# Patient Record
Sex: Female | Born: 1980 | Race: Asian | Hispanic: No | Marital: Married | Smoking: Never smoker
Health system: Southern US, Community
[De-identification: ages and names within clinical notes are randomized; demographics above are authoritative.]

## PROBLEM LIST (undated history)

## (undated) DIAGNOSIS — O009 Unspecified ectopic pregnancy without intrauterine pregnancy: Secondary | ICD-10-CM

## (undated) DIAGNOSIS — Z862 Personal history of diseases of the blood and blood-forming organs and certain disorders involving the immune mechanism: Secondary | ICD-10-CM

## (undated) DIAGNOSIS — A159 Respiratory tuberculosis unspecified: Secondary | ICD-10-CM

## (undated) HISTORY — DX: Personal history of diseases of the blood and blood-forming organs and certain disorders involving the immune mechanism: Z86.2

## (undated) HISTORY — DX: Respiratory tuberculosis unspecified: A15.9

## (undated) HISTORY — DX: Unspecified ectopic pregnancy without intrauterine pregnancy: O00.90

## (undated) HISTORY — PX: INDUCED ABORTION: SHX677

---

## 2011-04-04 DIAGNOSIS — O009 Unspecified ectopic pregnancy without intrauterine pregnancy: Secondary | ICD-10-CM

## 2011-04-04 HISTORY — DX: Unspecified ectopic pregnancy without intrauterine pregnancy: O00.90

## 2016-10-06 ENCOUNTER — Encounter: Payer: Self-pay | Admitting: Family Medicine

## 2016-10-06 ENCOUNTER — Other Ambulatory Visit (HOSPITAL_COMMUNITY)
Admission: RE | Admit: 2016-10-06 | Discharge: 2016-10-06 | Disposition: A | Discharge status: Home / Self Care | Payer: No Typology Code available for payment source | Source: Ambulatory Visit | Attending: Family Medicine | Admitting: Family Medicine

## 2016-10-06 ENCOUNTER — Ambulatory Visit (INDEPENDENT_AMBULATORY_CARE_PROVIDER_SITE_OTHER): Payer: No Typology Code available for payment source | Admitting: Family Medicine

## 2016-10-06 VITALS — BP 110/70 | HR 63 | Ht 62.0 in | Wt 106.2 lb

## 2016-10-06 DIAGNOSIS — Z124 Encounter for screening for malignant neoplasm of cervix: Secondary | ICD-10-CM | POA: Insufficient documentation

## 2016-10-06 DIAGNOSIS — Z862 Personal history of diseases of the blood and blood-forming organs and certain disorders involving the immune mechanism: Secondary | ICD-10-CM

## 2016-10-06 DIAGNOSIS — A159 Respiratory tuberculosis unspecified: Secondary | ICD-10-CM | POA: Diagnosis not present

## 2016-10-06 DIAGNOSIS — O009 Unspecified ectopic pregnancy without intrauterine pregnancy: Secondary | ICD-10-CM | POA: Diagnosis not present

## 2016-10-06 DIAGNOSIS — Z9229 Personal history of other drug therapy: Secondary | ICD-10-CM | POA: Insufficient documentation

## 2016-10-06 DIAGNOSIS — Z0000 Encounter for general adult medical examination without abnormal findings: Secondary | ICD-10-CM | POA: Insufficient documentation

## 2016-10-06 DIAGNOSIS — Z113 Encounter for screening for infections with a predominantly sexual mode of transmission: Secondary | ICD-10-CM | POA: Diagnosis not present

## 2016-10-06 LAB — COMPREHENSIVE METABOLIC PANEL
ALBUMIN: 4.6 g/dL (ref 3.6–5.1)
ALK PHOS: 33 U/L (ref 33–115)
ALT: 9 U/L (ref 6–29)
AST: 16 U/L (ref 10–30)
BUN: 12 mg/dL (ref 7–25)
CO2: 26 mmol/L (ref 20–31)
Calcium: 9.2 mg/dL (ref 8.6–10.2)
Chloride: 102 mmol/L (ref 98–110)
Creat: 0.8 mg/dL (ref 0.50–1.10)
Glucose, Bld: 93 mg/dL (ref 65–99)
POTASSIUM: 3.9 mmol/L (ref 3.5–5.3)
Sodium: 139 mmol/L (ref 135–146)
TOTAL PROTEIN: 7.1 g/dL (ref 6.1–8.1)
Total Bilirubin: 0.6 mg/dL (ref 0.2–1.2)

## 2016-10-06 LAB — CBC WITH DIFFERENTIAL/PLATELET
BASOS ABS: 0 {cells}/uL (ref 0–200)
BASOS PCT: 0 %
EOS ABS: 51 {cells}/uL (ref 15–500)
Eosinophils Relative: 1 %
HEMATOCRIT: 43.3 % (ref 35.0–45.0)
Hemoglobin: 14.2 g/dL (ref 11.7–15.5)
Lymphocytes Relative: 26 %
Lymphs Abs: 1326 cells/uL (ref 850–3900)
MCH: 32.2 pg (ref 27.0–33.0)
MCHC: 32.8 g/dL (ref 32.0–36.0)
MCV: 98.2 fL (ref 80.0–100.0)
MONO ABS: 204 {cells}/uL (ref 200–950)
MPV: 10.3 fL (ref 7.5–12.5)
Monocytes Relative: 4 %
NEUTROS ABS: 3519 {cells}/uL (ref 1500–7800)
Neutrophils Relative %: 69 %
Platelets: 151 10*3/uL (ref 140–400)
RBC: 4.41 MIL/uL (ref 3.80–5.10)
RDW: 13.1 % (ref 11.0–15.0)
WBC: 5.1 10*3/uL (ref 4.0–10.5)

## 2016-10-06 LAB — LIPID PANEL
CHOLESTEROL: 193 mg/dL (ref <200)
HDL: 67 mg/dL (ref >50)
LDL Cholesterol: 108 mg/dL — ABNORMAL HIGH (ref <100)
TRIGLYCERIDES: 90 mg/dL (ref <150)
Total CHOL/HDL Ratio: 2.9 Ratio (ref <5.0)
VLDL: 18 mg/dL (ref <30)

## 2016-10-06 LAB — POCT URINALYSIS DIP (PROADVANTAGE DEVICE)
BILIRUBIN UA: NEGATIVE
BILIRUBIN UA: NEGATIVE mg/dL
Glucose, UA: NEGATIVE mg/dL
LEUKOCYTES UA: NEGATIVE
NITRITE UA: NEGATIVE
Protein Ur, POC: NEGATIVE mg/dL
RBC UA: NEGATIVE
Specific Gravity, Urine: 1.025
Urobilinogen, Ur: NEGATIVE
pH, UA: 6 (ref 5.0–8.0)

## 2016-10-06 LAB — TSH: TSH: 2.94 mIU/L

## 2016-10-06 NOTE — Progress Notes (Signed)
Subjective:    Patient ID: Holly Dunlap, female    DOB: 1980-05-07, 36 y.o.   MRN: [id number]  HPI Chief Complaint  Patient presents with  . new pt    new pt cpe. wants to be checked for platelets cause of brusies   She is new to the practice and here for a complete physical exam. Moved here April 2017 from [place]. She lived in [place] before.   Previous medical care: in [place] and [place].    Other providers: none   Past medical history: TB treated- diagnosed in 2009. States she has an XR.   Social history: Lives with husband and 60 year old, is not working. Just started driving. Hopes to go to school for nursing, LPN.  Denies smoking, drinking alcohol, drug use  Diet: healthy  Excerise: nothing in several weeks   Immunizations: up to date.   Health maintenance:  Mammogram: N/A Colonoscopy: N/A Last Gynecological Exam: 3 years ago Last Menstrual cycle: 09/22/2016 Pregnancies: 4, 1 living Last Dental Exam: 2 years ago.  Last Eye Exam: prescription glasses.   Wears seatbelt always, uses sunscreen, smoke detectors in home and functioning, does not text while driving and feels safe in home environment.   Reviewed allergies, medications, past medical, surgical, family, and social history.    Review of Systems Review of Systems Constitutional: -fever, -chills, -sweats, -unexpected weight change,-fatigue ENT: -runny nose, -ear pain, -sore throat Cardiology:  -chest pain, -palpitations, -edema Respiratory: -cough, -shortness of breath, -wheezing Gastroenterology: -abdominal pain, -nausea, -vomiting, -diarrhea, -constipation  Hematology: -bleeding or bruising problems Musculoskeletal: -arthralgias, -myalgias, -joint swelling, -back pain Ophthalmology: -vision changes Urology: -dysuria, -difficulty urinating, -hematuria, -urinary frequency, -urgency Neurology: -headache, -weakness, -tingling, -numbness       Objective:   Physical Exam BP 110/70   Pulse 63   Ht 5'  2" (1.575 m)   Wt 106 lb 3.2 oz (48.2 kg)   LMP 09/22/2016   BMI 19.42 kg/m   General Appearance:    Alert, cooperative, no distress, appears stated age  Head:    Normocephalic, without obvious abnormality, atraumatic  Eyes:    PERRL, conjunctiva/corneas clear, EOM's intact, fundi    benign  Ears:    Normal TM's and external ear canals  Nose:   Nares normal, mucosa normal, no drainage or sinus   tenderness  Throat:   Lips, mucosa, and tongue normal; teeth and gums normal  Neck:   Supple, no lymphadenopathy;  thyroid:  no   enlargement/tenderness/nodules; no carotid   bruit or JVD  Back:    Spine nontender, no curvature, ROM normal, no CVA     tenderness  Lungs:     Clear to auscultation bilaterally without wheezes, rales or     ronchi; respirations unlabored  Chest Wall:    No tenderness or deformity   Heart:    Regular rate and rhythm, S1 and S2 normal, no murmur, rub   or gallop  Breast Exam:    No tenderness, masses, or nipple discharge or inversion.      No axillary lymphadenopathy  Abdomen:     Soft, non-tender, nondistended, normoactive bowel sounds,    no masses, no hepatosplenomegaly  Genitalia:    Normal external genitalia without lesions.  BUS and vagina normal; cervix without lesions, or cervical motion tenderness. No abnormal vaginal discharge.  Uterus and adnexa not enlarged, nontender, no masses.  Pap performed. Chaperone present.   Rectal:    Not performed due to age<40 and  no related complaints  Extremities:   No clubbing, cyanosis or edema  Pulses:   2+ and symmetric all extremities  Skin:   Skin color, texture, turgor normal, no rashes or lesions  Lymph nodes:   Cervical, supraclavicular, and axillary nodes normal  Neurologic:   CNII-XII intact, normal strength, sensation and gait; reflexes 2+ and symmetric throughout          Psych:   Normal mood, affect, hygiene and grooming.     Urinalysis dipstick: neg      Assessment & Plan:  Routine general medical  examination at a health care facility - Plan: POCT Urinalysis DIP (Proadvantage Device), CBC with Differential/Platelet, Comprehensive metabolic panel, Lipid panel, Cytology - PAP, TSH  History of thrombocytopenia - Plan: CBC with Differential/Platelet  TB (tuberculosis), treated  Ectopic pregnancy, unspecified location, unspecified whether intrauterine pregnancy present  Screening for cervical cancer - Plan: Cytology - PAP  Screen for STD (sexually transmitted disease) - Plan: RPR, HIV antibody  She appears to be doing well.  Discussed that she should tell anyone who checks her for a TB skin test of her history of being treated in 2009. She had appropriate follow up.  STD testing done.  Pap smear done.  Discussed safety and health promotion.  Follow up pending results.

## 2016-10-06 NOTE — Patient Instructions (Addendum)
Make sure you are getting at least 150 minutes of physical activity per week.  Call and schedule a dental exam for cleaning and preventive care.   we will call you with your lab results.    Preventative Care for Adults - Female      MAINTAIN REGULAR HEALTH EXAMS:  A routine yearly physical is a good way to check in with your primary care provider about your health and preventive screening. It is also an opportunity to share updates about your health and any concerns you have, and receive a thorough all-over exam.   Most health insurance companies pay for at least some preventative services.  Check with your health plan for specific coverages.  WHAT PREVENTATIVE SERVICES DO WOMEN NEED?  Adult women should have their weight and blood pressure checked regularly.   Women age 635 and older should have their cholesterol levels checked regularly.  Women should be screened for cervical cancer with a Pap smear and pelvic exam beginning at either age 35, or 3 years after they become sexually activity.    Breast cancer screening generally begins at age 35 with a mammogram and breast exam by your primary care provider.    Beginning at age 35 and continuing to age 35, women should be screened for colorectal cancer.  Certain people may need continued testing until age 35.  Updating vaccinations is part of preventative care.  Vaccinations help protect against diseases such as the flu.  Osteoporosis is a disease in which the bones lose minerals and strength as we age. Women ages 7665 and over should discuss this with their caregivers, as should women after menopause who have other risk factors.  Lab tests are generally done as part of preventative care to screen for anemia and blood disorders, to screen for problems with the kidneys and liver, to screen for bladder problems, to check blood sugar, and to check your cholesterol level.  Preventative services generally include counseling about diet,  exercise, avoiding tobacco, drugs, excessive alcohol consumption, and sexually transmitted infections.    GENERAL RECOMMENDATIONS FOR GOOD HEALTH:  Healthy diet:  Eat a variety of foods, including fruit, vegetables, animal or vegetable protein, such as meat, fish, chicken, and eggs, or beans, lentils, tofu, and grains, such as rice.  Drink plenty of water daily.  Decrease saturated fat in the diet, avoid lots of red meat, processed foods, sweets, fast foods, and fried foods.  Exercise:  Aerobic exercise helps maintain good heart health. At least 30-40 minutes of moderate-intensity exercise is recommended. For example, a brisk walk that increases your heart rate and breathing. This should be done on most days of the week.   Find a type of exercise or a variety of exercises that you enjoy so that it becomes a part of your daily life.  Examples are running, walking, swimming, water aerobics, and biking.  For motivation and support, explore group exercise such as aerobic class, spin class, Zumba, Yoga,or  martial arts, etc.    Set exercise goals for yourself, such as a certain weight goal, walk or run in a race such as a 5k walk/run.  Speak to your primary care provider about exercise goals.  Disease prevention:  If you smoke or chew tobacco, find out from your caregiver how to quit. It can literally save your life, no matter how long you have been a tobacco user. If you do not use tobacco, never begin.   Maintain a healthy diet and normal weight. Increased weight  leads to problems with blood pressure and diabetes.   The Body Mass Index or BMI is a way of measuring how much of your body is fat. Having a BMI above 27 increases the risk of heart disease, diabetes, hypertension, stroke and other problems related to obesity. Your caregiver can help determine your BMI and based on it develop an exercise and dietary program to help you achieve or maintain this important measurement at a healthful  level.  High blood pressure causes heart and blood vessel problems.  Persistent high blood pressure should be treated with medicine if weight loss and exercise do not work.   Fat and cholesterol leaves deposits in your arteries that can block them. This causes heart disease and vessel disease elsewhere in your body.  If your cholesterol is found to be high, or if you have heart disease or certain other medical conditions, then you may need to have your cholesterol monitored frequently and be treated with medication.   Ask if you should have a cardiac stress test if your history suggests this. A stress test is a test done on a treadmill that looks for heart disease. This test can find disease prior to there being a problem.  Menopause can be associated with physical symptoms and risks. Hormone replacement therapy is available to decrease these. You should talk to your caregiver about whether starting or continuing to take hormones is right for you.   Osteoporosis is a disease in which the bones lose minerals and strength as we age. This can result in serious bone fractures. Risk of osteoporosis can be identified using a bone density scan. Women ages 10 and over should discuss this with their caregivers, as should women after menopause who have other risk factors. Ask your caregiver whether you should be taking a calcium supplement and Vitamin D, to reduce the rate of osteoporosis.   Avoid drinking alcohol in excess (more than two drinks per day).  Avoid use of street drugs. Do not share needles with anyone. Ask for professional help if you need assistance or instructions on stopping the use of alcohol, cigarettes, and/or drugs.  Brush your teeth twice a day with fluoride toothpaste, and floss once a day. Good oral hygiene prevents tooth decay and gum disease. The problems can be painful, unattractive, and can cause other health problems. Visit your dentist for a routine oral and dental check up and  preventive care every 6-12 months.   Look at your skin regularly.  Use a mirror to look at your back. Notify your caregivers of changes in moles, especially if there are changes in shapes, colors, a size larger than a pencil eraser, an irregular border, or development of new moles.  Safety:  Use seatbelts 100% of the time, whether driving or as a passenger.  Use safety devices such as hearing protection if you work in environments with loud noise or significant background noise.  Use safety glasses when doing any work that could send debris in to the eyes.  Use a helmet if you ride a bike or motorcycle.  Use appropriate safety gear for contact sports.  Talk to your caregiver about gun safety.  Use sunscreen with a SPF (or skin protection factor) of 15 or greater.  Lighter skinned people are at a greater risk of skin cancer. Don't forget to also wear sunglasses in order to protect your eyes from too much damaging sunlight. Damaging sunlight can accelerate cataract formation.   Practice safe sex. Use condoms. Condoms  are used for birth control and to help reduce the spread of sexually transmitted infections (or STIs).  Some of the STIs are gonorrhea (the clap), chlamydia, syphilis, trichomonas, herpes, HPV (human papilloma virus) and HIV (human immunodeficiency virus) which causes AIDS. The herpes, HIV and HPV are viral illnesses that have no cure. These can result in disability, cancer and death.   Keep carbon monoxide and smoke detectors in your home functioning at all times. Change the batteries every 6 months or use a model that plugs into the wall.   Vaccinations:  Stay up to date with your tetanus shots and other required immunizations. You should have a booster for tetanus every 10 years. Be sure to get your flu shot every year, since 5%-20% of the [place] population comes down with the flu. The flu vaccine changes each year, so being vaccinated once is not enough. Get your shot in the fall, before  the flu season peaks.   Other vaccines to consider:  Human Papilloma Virus or HPV causes cancer of the cervix, and other infections that can be transmitted from person to person. There is a vaccine for HPV, and females should get immunized between the ages of 29 and 29. It requires a series of 3 shots.   Pneumococcal vaccine to protect against certain types of pneumonia.  This is normally recommended for adults age 34 or older.  However, adults younger than 34 years old with certain underlying conditions such as diabetes, heart or lung disease should also receive the vaccine.  Shingles vaccine to protect against Varicella Zoster if you are older than age 4, or younger than 35 years old with certain underlying illness.  Hepatitis A vaccine to protect against a form of infection of the liver by a virus acquired from food.  Hepatitis B vaccine to protect against a form of infection of the liver by a virus acquired from blood or body fluids, particularly if you work in health care.  If you plan to travel internationally, check with your local health department for specific vaccination recommendations.  Cancer Screening:  Breast cancer screening is essential to preventive care for women. All women age 1 and older should perform a breast self-exam every month. At age 39 and older, women should have their caregiver complete a breast exam each year. Women at ages 83 and older should have a mammogram (x-ray film) of the breasts. Your caregiver can discuss how often you need mammograms.    Cervical cancer screening includes taking a Pap smear (sample of cells examined under a microscope) from the cervix (end of the uterus). It also includes testing for HPV (Human Papilloma Virus, which can cause cervical cancer). Screening and a pelvic exam should begin at age 31, or 3 years after a woman becomes sexually active. Screening should occur every year, with a Pap smear but no HPV testing, up to age 73. After  age 64, you should have a Pap smear every 3 years with HPV testing, if no HPV was found previously.   Most routine colon cancer screening begins at the age of 78. On a yearly basis, doctors may provide special easy to use take-home tests to check for hidden blood in the stool. Sigmoidoscopy or colonoscopy can detect the earliest forms of colon cancer and is life saving. These tests use a small camera at the end of a tube to directly examine the colon. Speak to your caregiver about this at age 76, when routine screening begins (and is repeated  every 5 years unless early forms of pre-cancerous polyps or small growths are found).

## 2016-10-07 LAB — RPR

## 2016-10-07 LAB — HIV ANTIBODY (ROUTINE TESTING W REFLEX): HIV: NONREACTIVE

## 2016-10-09 LAB — CYTOLOGY - PAP
Chlamydia: NEGATIVE
DIAGNOSIS: NEGATIVE
HPV (WINDOPATH): NOT DETECTED
NEISSERIA GONORRHEA: NEGATIVE

## 2016-10-10 ENCOUNTER — Encounter: Payer: Self-pay | Admitting: Family Medicine

## 2017-11-12 ENCOUNTER — Encounter: Payer: Self-pay | Admitting: Family Medicine

## 2017-11-23 ENCOUNTER — Encounter: Payer: Self-pay | Admitting: Family Medicine

## 2017-11-23 ENCOUNTER — Ambulatory Visit
Admission: RE | Admit: 2017-11-23 | Discharge: 2017-11-23 | Disposition: A | Discharge status: Home / Self Care | Payer: No Typology Code available for payment source | Source: Ambulatory Visit | Attending: Family Medicine | Admitting: Family Medicine

## 2017-11-23 ENCOUNTER — Ambulatory Visit (INDEPENDENT_AMBULATORY_CARE_PROVIDER_SITE_OTHER): Payer: No Typology Code available for payment source | Admitting: Family Medicine

## 2017-11-23 ENCOUNTER — Encounter: Payer: No Typology Code available for payment source | Admitting: Family Medicine

## 2017-11-23 VITALS — BP 122/72 | HR 72 | Temp 98.2°F | Resp 16 | Ht 61.75 in | Wt 105.2 lb

## 2017-11-23 DIAGNOSIS — Z0000 Encounter for general adult medical examination without abnormal findings: Secondary | ICD-10-CM | POA: Diagnosis not present

## 2017-11-23 DIAGNOSIS — R319 Hematuria, unspecified: Secondary | ICD-10-CM

## 2017-11-23 DIAGNOSIS — R05 Cough: Secondary | ICD-10-CM

## 2017-11-23 DIAGNOSIS — E7800 Pure hypercholesterolemia, unspecified: Secondary | ICD-10-CM | POA: Diagnosis not present

## 2017-11-23 DIAGNOSIS — R058 Other specified cough: Secondary | ICD-10-CM

## 2017-11-23 LAB — POCT URINALYSIS DIP (PROADVANTAGE DEVICE)
Bilirubin, UA: NEGATIVE
Glucose, UA: NEGATIVE mg/dL
Ketones, POC UA: NEGATIVE mg/dL
Leukocytes, UA: NEGATIVE
Nitrite, UA: NEGATIVE
PROTEIN UA: NEGATIVE mg/dL
SPECIFIC GRAVITY, URINE: 1.01
UUROB: NEGATIVE
pH, UA: 6 (ref 5.0–8.0)

## 2017-11-23 MED ORDER — AZITHROMYCIN 250 MG PO TABS: ORAL_TABLET | ORAL | 0 refills | Status: DC

## 2017-11-23 NOTE — Patient Instructions (Addendum)
Go to [facility] and get the chest X ray.  Take the antibiotic as prescribed. Stay well hydrated.   If you are not back to baseline in 10 days or if you get worse then follow up with me.    Preventative Care for Adults - Female      MAINTAIN REGULAR HEALTH EXAMS:  A routine yearly physical is a good way to check in with your primary care provider about your health and preventive screening. It is also an opportunity to share updates about your health and any concerns you have, and receive a thorough all-over exam.   Most health insurance companies pay for at least some preventative services.  Check with your health plan for specific coverages.  WHAT PREVENTATIVE SERVICES DO WOMEN NEED?  Adult women should have their weight and blood pressure checked regularly.   Women age 55 and older should have their cholesterol levels checked regularly.  Women should be screened for cervical cancer with a Pap smear and pelvic exam beginning at either age 8, or 3 years after they become sexually activity.    Breast cancer screening generally begins at age 66 with a mammogram and breast exam by your primary care provider.    Beginning at age 51 and continuing to age 53, women should be screened for colorectal cancer.  Certain people may need continued testing until age 69.  Updating vaccinations is part of preventative care.  Vaccinations help protect against diseases such as the flu.  Osteoporosis is a disease in which the bones lose minerals and strength as we age. Women ages 63 and over should discuss this with their caregivers, as should women after menopause who have other risk factors.  Lab tests are generally done as part of preventative care to screen for anemia and blood disorders, to screen for problems with the kidneys and liver, to screen for bladder problems, to check blood sugar, and to check your cholesterol level.  Preventative services generally include counseling about diet,  exercise, avoiding tobacco, drugs, excessive alcohol consumption, and sexually transmitted infections.    GENERAL RECOMMENDATIONS FOR GOOD HEALTH:  Healthy diet:  Eat a variety of foods, including fruit, vegetables, animal or vegetable protein, such as meat, fish, chicken, and eggs, or beans, lentils, tofu, and grains, such as rice.  Drink plenty of water daily.  Decrease saturated fat in the diet, avoid lots of red meat, processed foods, sweets, fast foods, and fried foods.  Exercise:  Aerobic exercise helps maintain good heart health. At least 30-40 minutes of moderate-intensity exercise is recommended. For example, a brisk walk that increases your heart rate and breathing. This should be done on most days of the week.   Find a type of exercise or a variety of exercises that you enjoy so that it becomes a part of your daily life.  Examples are running, walking, swimming, water aerobics, and biking.  For motivation and support, explore group exercise such as aerobic class, spin class, Zumba, Yoga,or  martial arts, etc.    Set exercise goals for yourself, such as a certain weight goal, walk or run in a race such as a 5k walk/run.  Speak to your primary care provider about exercise goals.  Disease prevention:  If you smoke or chew tobacco, find out from your caregiver how to quit. It can literally save your life, no matter how long you have been a tobacco user. If you do not use tobacco, never begin.   Maintain a healthy diet  and normal weight. Increased weight leads to problems with blood pressure and diabetes.   The Body Mass Index or BMI is a way of measuring how much of your body is fat. Having a BMI above 27 increases the risk of heart disease, diabetes, hypertension, stroke and other problems related to obesity. Your caregiver can help determine your BMI and based on it develop an exercise and dietary program to help you achieve or maintain this important measurement at a healthful  level.  High blood pressure causes heart and blood vessel problems.  Persistent high blood pressure should be treated with medicine if weight loss and exercise do not work.   Fat and cholesterol leaves deposits in your arteries that can block them. This causes heart disease and vessel disease elsewhere in your body.  If your cholesterol is found to be high, or if you have heart disease or certain other medical conditions, then you may need to have your cholesterol monitored frequently and be treated with medication.   Ask if you should have a cardiac stress test if your history suggests this. A stress test is a test done on a treadmill that looks for heart disease. This test can find disease prior to there being a problem.  Menopause can be associated with physical symptoms and risks. Hormone replacement therapy is available to decrease these. You should talk to your caregiver about whether starting or continuing to take hormones is right for you.   Osteoporosis is a disease in which the bones lose minerals and strength as we age. This can result in serious bone fractures. Risk of osteoporosis can be identified using a bone density scan. Women ages 2265 and over should discuss this with their caregivers, as should women after menopause who have other risk factors. Ask your caregiver whether you should be taking a calcium supplement and Vitamin D, to reduce the rate of osteoporosis.   Avoid drinking alcohol in excess (more than two drinks per day).  Avoid use of street drugs. Do not share needles with anyone. Ask for professional help if you need assistance or instructions on stopping the use of alcohol, cigarettes, and/or drugs.  Brush your teeth twice a day with fluoride toothpaste, and floss once a day. Good oral hygiene prevents tooth decay and gum disease. The problems can be painful, unattractive, and can cause other health problems. Visit your dentist for a routine oral and dental check up and  preventive care every 6-12 months.   Look at your skin regularly.  Use a mirror to look at your back. Notify your caregivers of changes in moles, especially if there are changes in shapes, colors, a size larger than a pencil eraser, an irregular border, or development of new moles.  Safety:  Use seatbelts 100% of the time, whether driving or as a passenger.  Use safety devices such as hearing protection if you work in environments with loud noise or significant background noise.  Use safety glasses when doing any work that could send debris in to the eyes.  Use a helmet if you ride a bike or motorcycle.  Use appropriate safety gear for contact sports.  Talk to your caregiver about gun safety.  Use sunscreen with a SPF (or skin protection factor) of 15 or greater.  Lighter skinned people are at a greater risk of skin cancer. Don't forget to also wear sunglasses in order to protect your eyes from too much damaging sunlight. Damaging sunlight can accelerate cataract formation.   Practice  safe sex. Use condoms. Condoms are used for birth control and to help reduce the spread of sexually transmitted infections (or STIs).  Some of the STIs are gonorrhea (the clap), chlamydia, syphilis, trichomonas, herpes, HPV (human papilloma virus) and HIV (human immunodeficiency virus) which causes AIDS. The herpes, HIV and HPV are viral illnesses that have no cure. These can result in disability, cancer and death.   Keep carbon monoxide and smoke detectors in your home functioning at all times. Change the batteries every 6 months or use a model that plugs into the wall.   Vaccinations:  Stay up to date with your tetanus shots and other required immunizations. You should have a booster for tetanus every 10 years. Be sure to get your flu shot every year, since 5%-20% of the [place] population comes down with the flu. The flu vaccine changes each year, so being vaccinated once is not enough. Get your shot in the fall, before  the flu season peaks.   Other vaccines to consider:  Human Papilloma Virus or HPV causes cancer of the cervix, and other infections that can be transmitted from person to person. There is a vaccine for HPV, and females should get immunized between the ages of 41 and 1. It requires a series of 3 shots.   Pneumococcal vaccine to protect against certain types of pneumonia.  This is normally recommended for adults age 70 or older.  However, adults younger than 36 years old with certain underlying conditions such as diabetes, heart or lung disease should also receive the vaccine.  Shingles vaccine to protect against Varicella Zoster if you are older than age 86, or younger than 36 years old with certain underlying illness.  Hepatitis A vaccine to protect against a form of infection of the liver by a virus acquired from food.  Hepatitis B vaccine to protect against a form of infection of the liver by a virus acquired from blood or body fluids, particularly if you work in health care.  If you plan to travel internationally, check with your local health department for specific vaccination recommendations.  Cancer Screening:  Breast cancer screening is essential to preventive care for women. All women age 74 and older should perform a breast self-exam every month. At age 17 and older, women should have their caregiver complete a breast exam each year. Women at ages 10 and older should have a mammogram (x-ray film) of the breasts. Your caregiver can discuss how often you need mammograms.    Cervical cancer screening includes taking a Pap smear (sample of cells examined under a microscope) from the cervix (end of the uterus). It also includes testing for HPV (Human Papilloma Virus, which can cause cervical cancer). Screening and a pelvic exam should begin at age 48, or 3 years after a woman becomes sexually active. Screening should occur every year, with a Pap smear but no HPV testing, up to age 16. After  age 40, you should have a Pap smear every 3 years with HPV testing, if no HPV was found previously.   Most routine colon cancer screening begins at the age of 32. On a yearly basis, doctors may provide special easy to use take-home tests to check for hidden blood in the stool. Sigmoidoscopy or colonoscopy can detect the earliest forms of colon cancer and is life saving. These tests use a small camera at the end of a tube to directly examine the colon. Speak to your caregiver about this at age 23, when routine  screening begins (and is repeated every 5 years unless early forms of pre-cancerous polyps or small growths are found).

## 2017-11-23 NOTE — Progress Notes (Signed)
Subjective:    Patient ID: Holly Dunlap, female    DOB: 1980-06-18, 37 y.o.   MRN: [id number]  HPI Chief Complaint  Patient presents with  . cpe    cpe, coughing for 3 weeks- staying about the same. gets eye examed yearly. denies flu shot. no other concerns. 10 years ago treated for tb in [place] wants to know if it has come back   She here for a complete physical exam and for a new complaint.  Moved here April 2017 from [place]. She lived in [place] before.  Last CPE: 10/06/2016 here   Complains of a 3 week history of productive cough without blood, fever, chills, night sweats, chest pain, shortness of breath, wheezing. History of treated latent TB in 2009.  Started with URI symptoms including rhinorrhea, nasal congestion, sore throat and these symptoms have resolved.   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  Denies smoking, drinking alcohol, drug use  Diet: healthy  Excerise: nothing regular. Walks in the park some.   Immunizations: UTD  Health maintenance:  Mammogram: N/A Colonoscopy: N/A Last Gynecological Exam: Pap smear up to date  Last Menstrual cycle: 10/25/2017. Periods are regular. Uses condoms  Last Dental Exam: 03/2017  Last Eye Exam: 03/2017  Wears seatbelt always, uses sunscreen, smoke detectors in home and functioning, does not text while driving and feels safe in home environment.   Reviewed allergies, medications, past medical, surgical, family, and social history.    Review of Systems Review of Systems Constitutional: -fever, -chills, -sweats, -unexpected weight change,-fatigue ENT: -runny nose, -ear pain, -sore throat Cardiology:  -chest pain, -palpitations, -edema Respiratory: +cough, -shortness of breath, -wheezing Gastroenterology: -abdominal pain, -nausea, -vomiting, -diarrhea, -constipation  Hematology: -bleeding or bruising  problems Musculoskeletal: -arthralgias, -myalgias, -joint swelling, -back pain Ophthalmology: -vision changes Urology: -dysuria, -difficulty urinating, -hematuria, -urinary frequency, -urgency Neurology: -headache, -weakness, -tingling, -numbness        Objective:   Physical Exam BP 122/72   Pulse 72   Temp 98.2 F (36.8 C) (Oral)   Resp 16   Ht 5' 1.75" (1.568 m)   Wt 105 lb 3.2 oz (47.7 kg)   LMP 10/26/2017   SpO2 97%   BMI 19.40 kg/m   General Appearance:    Alert, cooperative, no distress, appears stated age  Head:    Normocephalic, without obvious abnormality, atraumatic  Eyes:    PERRL, conjunctiva/corneas clear, EOM's intact, fundi    benign  Ears:    Normal TM's and external ear canals  Nose:   Nares normal, mucosa normal, no drainage or sinus   tenderness  Throat:   Lips, mucosa, and tongue normal; teeth and gums normal  Neck:   Supple, no lymphadenopathy;  thyroid:  no   enlargement/tenderness/nodules; no carotid   bruit or JVD  Back:    Spine nontender, no curvature, ROM normal, no CVA     tenderness  Lungs:     Clear to auscultation bilaterally without wheezes, rales or     ronchi; respirations unlabored  Chest Wall:    No tenderness or deformity   Heart:    Regular rate and rhythm, S1 and S2 normal, no murmur, rub   or gallop  Breast Exam:    No tenderness, masses, or nipple discharge or inversion.      No axillary lymphadenopathy  Abdomen:  Soft, non-tender, nondistended, normoactive bowel sounds,    no masses, no hepatosplenomegaly  Genitalia:    Declines. Pap up to date.   Rectal:    Not performed due to age<40 and no related complaints  Extremities:   No clubbing, cyanosis or edema  Pulses:   2+ and symmetric all extremities  Skin:   Skin color, texture, turgor normal, no rashes or lesions  Lymph nodes:   Cervical, supraclavicular, and axillary nodes normal  Neurologic:   CNII-XII intact, normal strength, sensation and gait; reflexes 2+ and symmetric  throughout          Psych:   Normal mood, affect, hygiene and grooming.     Urinalysis dipstick: moderate amount of blood       Assessment & Plan:  Routine general medical examination at a health care facility - Plan: CBC with Differential/Platelet, Comprehensive metabolic panel, POCT Urinalysis DIP (Proadvantage Device), Lipid panel  Productive cough - Plan: DG Chest 2 View, azithromycin (ZITHROMAX Z-PAK) 250 MG tablet  Elevated LDL cholesterol level - Plan: Lipid panel  Hematuria, unspecified type - Plan: Urine Microscopic  She is doing well overall.  Will treat cough with Z-pak. Discussed OTC treatment as well. Send for CXR. Follow up if not back to baseline or if XR abnormal.  UA with moderate blood. Check microscopy and repeat if microscopic RBCs present. Asymptomatic.  Will need a repeat UA in 2 weeks if positive.  Immunizations up to date.  Discussed safety and health promotion.  Recommend increasing physical activity.  Check fasting lipids.  Follow up pending labs and XR.

## 2017-11-24 LAB — COMPREHENSIVE METABOLIC PANEL
A/G RATIO: 1.8 (ref 1.2–2.2)
ALT: 9 IU/L (ref 0–32)
AST: 16 IU/L (ref 0–40)
Albumin: 4.4 g/dL (ref 3.5–5.5)
Alkaline Phosphatase: 34 IU/L — ABNORMAL LOW (ref 39–117)
BUN/Creatinine Ratio: 12 (ref 9–23)
BUN: 10 mg/dL (ref 6–20)
Bilirubin Total: 0.6 mg/dL (ref 0.0–1.2)
CALCIUM: 9.1 mg/dL (ref 8.7–10.2)
CO2: 25 mmol/L (ref 20–29)
CREATININE: 0.82 mg/dL (ref 0.57–1.00)
Chloride: 102 mmol/L (ref 96–106)
GFR, EST AFRICAN AMERICAN: 106 mL/min/{1.73_m2} (ref >59)
GFR, EST NON AFRICAN AMERICAN: 92 mL/min/{1.73_m2} (ref >59)
Globulin, Total: 2.5 g/dL (ref 1.5–4.5)
Glucose: 81 mg/dL (ref 65–99)
POTASSIUM: 3.7 mmol/L (ref 3.5–5.2)
Sodium: 139 mmol/L (ref 134–144)
TOTAL PROTEIN: 6.9 g/dL (ref 6.0–8.5)

## 2017-11-24 LAB — CBC WITH DIFFERENTIAL/PLATELET
BASOS ABS: 0 10*3/uL (ref 0.0–0.2)
Basos: 1 %
EOS (ABSOLUTE): 0.1 10*3/uL (ref 0.0–0.4)
Eos: 1 %
Hematocrit: 41.7 % (ref 34.0–46.6)
Hemoglobin: 13.8 g/dL (ref 11.1–15.9)
IMMATURE GRANULOCYTES: 0 %
Immature Grans (Abs): 0 10*3/uL (ref 0.0–0.1)
LYMPHS: 33 %
Lymphocytes Absolute: 1.3 10*3/uL (ref 0.7–3.1)
MCH: 32.6 pg (ref 26.6–33.0)
MCHC: 33.1 g/dL (ref 31.5–35.7)
MCV: 99 fL — AB (ref 79–97)
Monocytes Absolute: 0.3 10*3/uL (ref 0.1–0.9)
Monocytes: 6 %
NEUTROS PCT: 59 %
Neutrophils Absolute: 2.4 10*3/uL (ref 1.4–7.0)
PLATELETS: 167 10*3/uL (ref 150–450)
RBC: 4.23 x10E6/uL (ref 3.77–5.28)
RDW: 13.4 % (ref 12.3–15.4)
WBC: 4 10*3/uL (ref 3.4–10.8)

## 2017-11-24 LAB — LIPID PANEL
CHOL/HDL RATIO: 2.8 ratio (ref 0.0–4.4)
Cholesterol, Total: 192 mg/dL (ref 100–199)
HDL: 68 mg/dL (ref >39)
LDL CALC: 115 mg/dL — AB (ref 0–99)
Triglycerides: 45 mg/dL (ref 0–149)
VLDL CHOLESTEROL CAL: 9 mg/dL (ref 5–40)

## 2017-11-24 LAB — URINALYSIS, MICROSCOPIC ONLY
Casts: NONE SEEN /lpf
Epithelial Cells (non renal): NONE SEEN /hpf (ref 0–10)

## 2017-11-26 ENCOUNTER — Other Ambulatory Visit: Payer: Self-pay | Admitting: Family Medicine

## 2017-11-26 DIAGNOSIS — R3121 Asymptomatic microscopic hematuria: Secondary | ICD-10-CM

## 2017-11-26 DIAGNOSIS — R319 Hematuria, unspecified: Secondary | ICD-10-CM

## 2018-01-04 ENCOUNTER — Encounter: Payer: Self-pay | Admitting: Family Medicine

## 2018-01-04 ENCOUNTER — Ambulatory Visit (INDEPENDENT_AMBULATORY_CARE_PROVIDER_SITE_OTHER): Payer: No Typology Code available for payment source | Admitting: Family Medicine

## 2018-01-04 VITALS — BP 120/70 | HR 67 | Wt 104.4 lb

## 2018-01-04 DIAGNOSIS — R350 Frequency of micturition: Secondary | ICD-10-CM

## 2018-01-04 DIAGNOSIS — Z3201 Encounter for pregnancy test, result positive: Secondary | ICD-10-CM | POA: Diagnosis not present

## 2018-01-04 LAB — POCT URINALYSIS DIP (PROADVANTAGE DEVICE)
Bilirubin, UA: NEGATIVE
Glucose, UA: NEGATIVE mg/dL
Ketones, POC UA: NEGATIVE mg/dL
Leukocytes, UA: NEGATIVE
NITRITE UA: NEGATIVE
PROTEIN UA: NEGATIVE mg/dL
RBC UA: NEGATIVE
SPECIFIC GRAVITY, URINE: 1.03
UUROB: NEGATIVE
pH, UA: 6 (ref 5.0–8.0)

## 2018-01-04 LAB — POCT URINE PREGNANCY: PREG TEST UR: POSITIVE — AB

## 2018-01-04 NOTE — Patient Instructions (Addendum)
[facility] [address] [place], [place] [phone]   Obgyn Offices:   [facility] [address] [place], [place] [address] [phone]  [facility] Address: [address]                   [place], [place] [address] Phone: [phone]  [facility] [address] [place], [place] [address] Phone: [phone]  [facility] [address] [place], [place] [address] Phone: [phone]

## 2018-01-04 NOTE — Progress Notes (Signed)
   Subjective:    Patient ID: Holly Dunlap, female    DOB: Feb 13, 1981, 37 y.o.   MRN: [id number]  HPI Chief Complaint  Patient presents with  . possible pregnant    possible pregnant. took positive pregnancy test on october 2nd. lsat period in august   She is here requesting a pregnancy test. States she had a positive home pregnancy test 2 days ago. Noticed breast tenderness.  Urinary frequency for the past week. No dysuria or urgency.   History of ectopic pregnancy in 2012 or 2013.  She has a 52-year-old son. States she is considering termination. She was not attempting pregnancy. States she was using condoms.   Denies fever, chills, dizziness, chest pain, palpitations, shortness of breath, abdominal pain, back pain, N/V/D, vaginal discharge.   LMP: November 23 2017  EDD: May 2020  Reviewed allergies, medications, past medical, surgical, family, and social history.    Review of Systems Pertinent positives and negatives in the history of present illness.     Objective:   Physical Exam BP 120/70   Pulse 67   Wt 104 lb 6.4 oz (47.4 kg)   LMP 11/23/2017   BMI 19.25 kg/m   Alert and in no distress.  Cardiac exam shows a regular sinus rhythm without murmurs or gallops. Lungs are clear to auscultation. Abdomen is soft, nondistended, nontender, no CVA tenderness.  Extremities without edema, pulses intact.  Skin is warm and dry, no pallor      Assessment & Plan:  Positive pregnancy test - Plan: POCT urine pregnancy, CBC with Differential/Platelet, Beta hCG quant (ref lab), POCT Urinalysis DIP (Proadvantage Device)  Urinary frequency - Plan: POCT Urinalysis DIP (Proadvantage Device)  Urinalysis dipstick: normal UPT positive Patient was not expecting to become pregnant and is considering termination.  Advised to start on prenatal vitamin and to avoid harmful medications, alcohol, any other unhealthy behaviors in case she decides to keep the baby. A list of OB/GYN offices and  information with [facility] was provided. She has a history of ectopic pregnancy and we did discuss symptoms of this and when to seek immediate medical attention. Check labs and follow-up

## 2018-01-05 LAB — CBC WITH DIFFERENTIAL/PLATELET
BASOS ABS: 0 10*3/uL (ref 0.0–0.2)
BASOS: 1 %
EOS (ABSOLUTE): 0 10*3/uL (ref 0.0–0.4)
Eos: 0 %
HEMOGLOBIN: 13.8 g/dL (ref 11.1–15.9)
Hematocrit: 40.7 % (ref 34.0–46.6)
IMMATURE GRANS (ABS): 0 10*3/uL (ref 0.0–0.1)
Immature Granulocytes: 0 %
LYMPHS ABS: 1.2 10*3/uL (ref 0.7–3.1)
LYMPHS: 19 %
MCH: 32.3 pg (ref 26.6–33.0)
MCHC: 33.9 g/dL (ref 31.5–35.7)
MCV: 95 fL (ref 79–97)
MONOCYTES: 6 %
Monocytes Absolute: 0.4 10*3/uL (ref 0.1–0.9)
Neutrophils Absolute: 4.6 10*3/uL (ref 1.4–7.0)
Neutrophils: 74 %
PLATELETS: 154 10*3/uL (ref 150–450)
RBC: 4.27 x10E6/uL (ref 3.77–5.28)
RDW: 12.2 % — AB (ref 12.3–15.4)
WBC: 6.2 10*3/uL (ref 3.4–10.8)

## 2018-01-05 LAB — BETA HCG QUANT (REF LAB): hCG Quant: 23929 m[IU]/mL

## 2020-05-25 IMAGING — CR DG CHEST 2V
2 series · 2 of 2 positions shown · non-contrast
Comparison: No prior.

CLINICAL DATA: Productive cough.  History of TB.

EXAM:
CHEST - 2 VIEW

[w chest pa *]
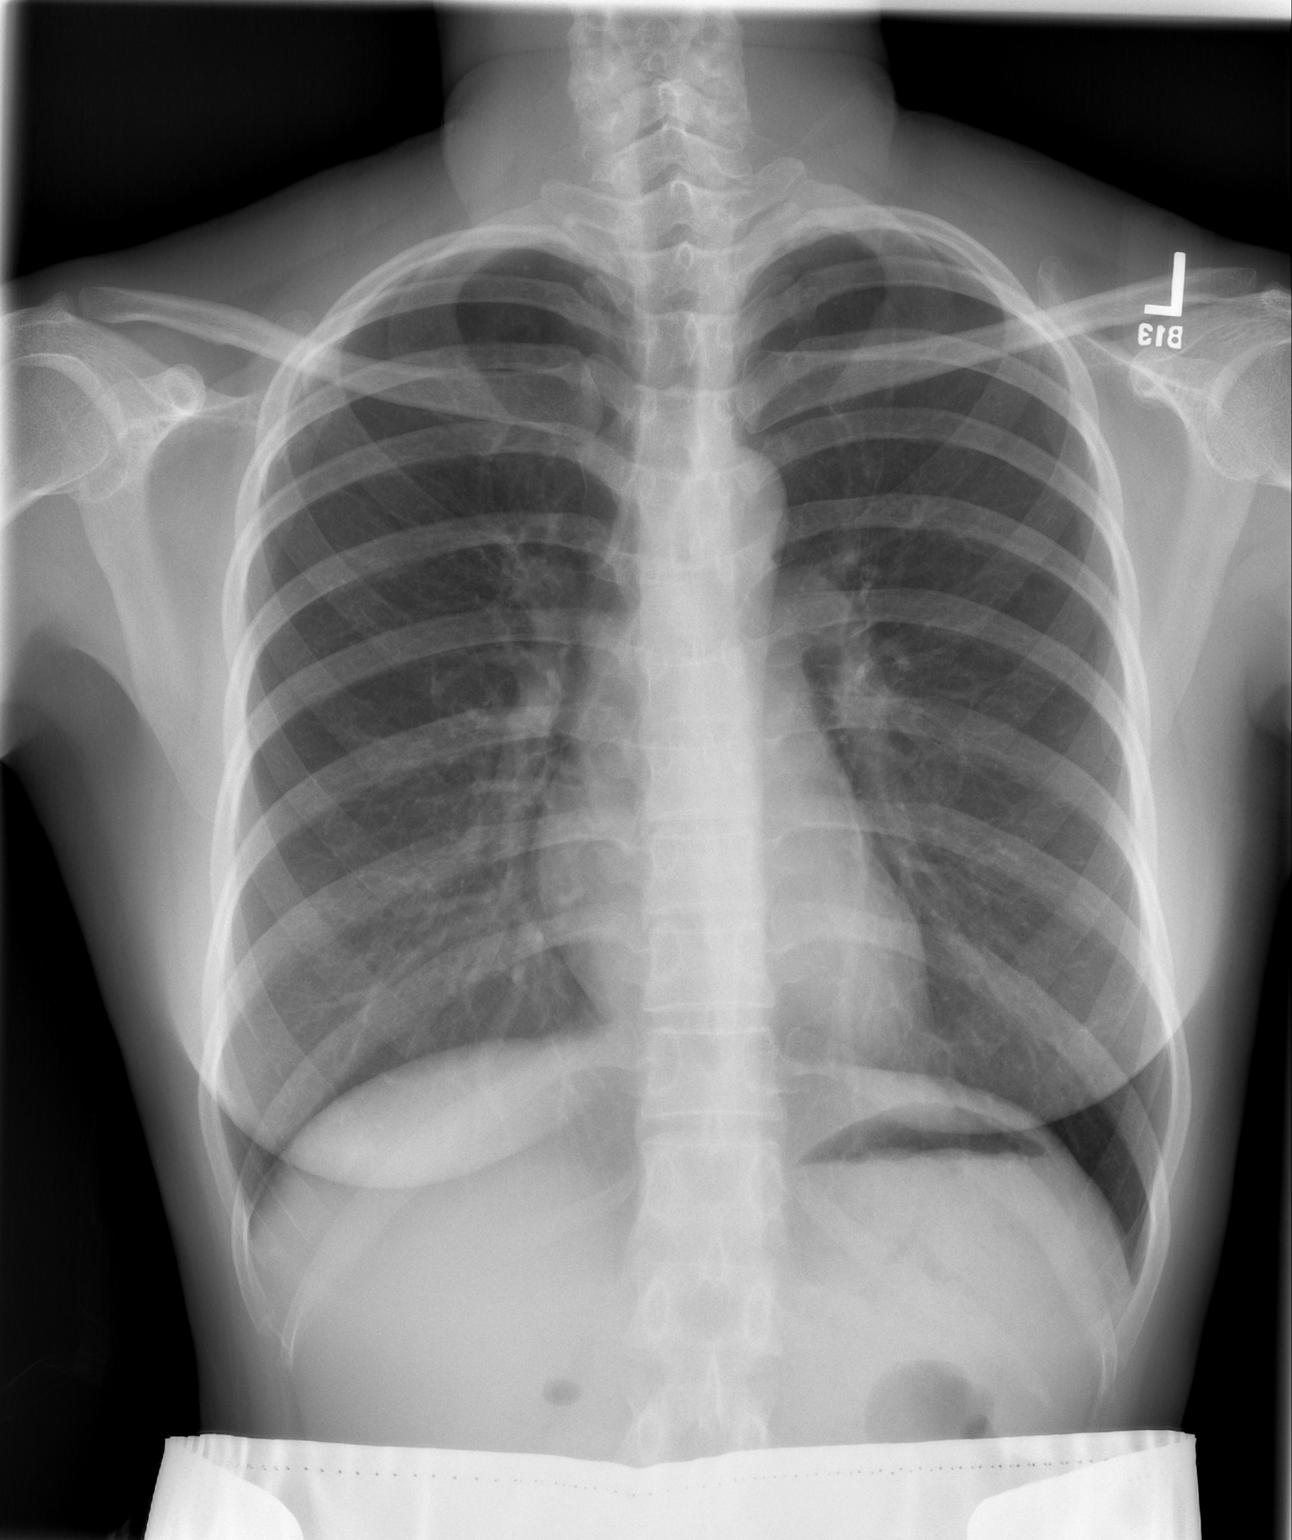

[w chest lat]
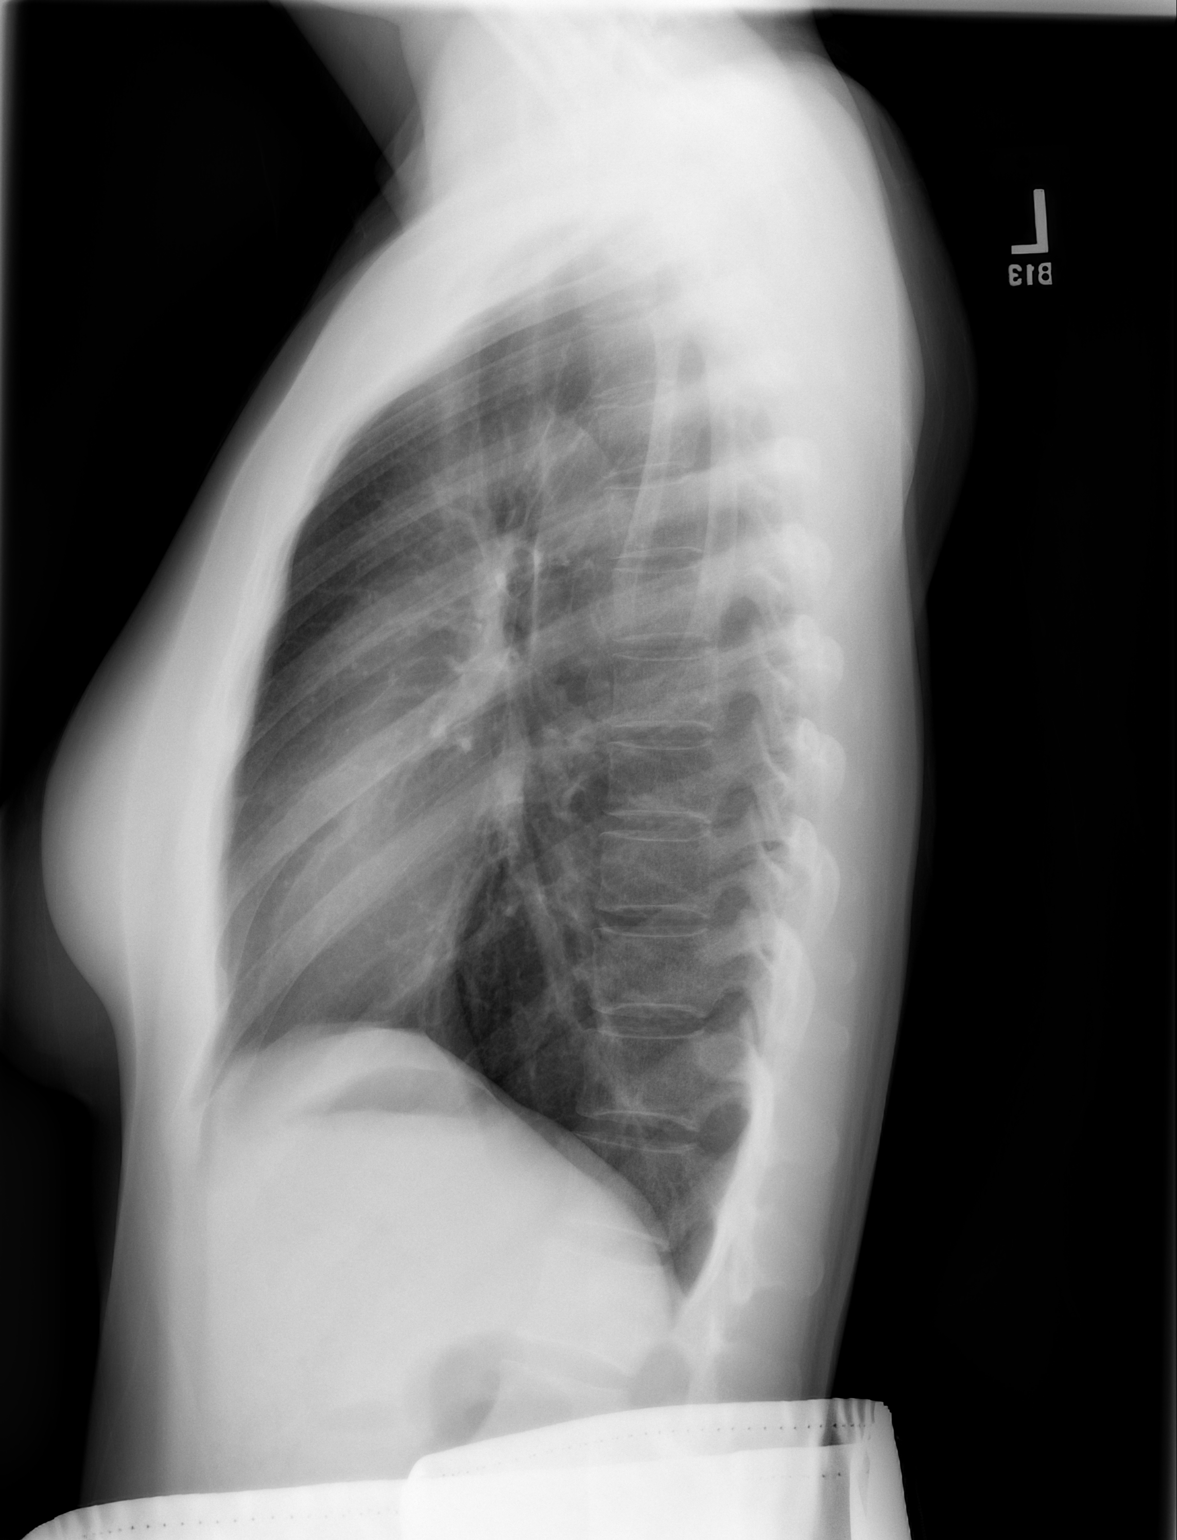

[2 of 2 positions shown; findings below may reference images not displayed]

FINDINGS: Mediastinum and hilar structures normal. Lungs are clear. No pleural
effusion or pneumothorax. Heart size normal. No acute bony
abnormality.
IMPRESSION: No acute cardiopulmonary disease. No focal pulmonary abnormality
identified.
